# Patient Record
Sex: Male | Born: 1975 | Race: Black or African American | Hispanic: No | Marital: Single | Smoking: Former smoker
Health system: Southern US, Community
[De-identification: ages and names within clinical notes are randomized; demographics above are authoritative.]

## PROBLEM LIST (undated history)

## (undated) DIAGNOSIS — E785 Hyperlipidemia, unspecified: Secondary | ICD-10-CM

## (undated) DIAGNOSIS — A498 Other bacterial infections of unspecified site: Secondary | ICD-10-CM

## (undated) DIAGNOSIS — I10 Essential (primary) hypertension: Secondary | ICD-10-CM

## (undated) HISTORY — DX: Hyperlipidemia, unspecified: E78.5

## (undated) HISTORY — DX: Essential (primary) hypertension: I10

## (undated) HISTORY — PX: FECAL TRANSPLANT: SHX6383

---

## 1996-06-09 HISTORY — PX: SPINAL FUSION: SHX223

## 2015-11-26 DIAGNOSIS — R030 Elevated blood-pressure reading, without diagnosis of hypertension: Secondary | ICD-10-CM

## 2015-11-26 DIAGNOSIS — E7800 Pure hypercholesterolemia, unspecified: Secondary | ICD-10-CM | POA: Insufficient documentation

## 2015-11-26 DIAGNOSIS — IMO0001 Reserved for inherently not codable concepts without codable children: Secondary | ICD-10-CM | POA: Insufficient documentation

## 2015-12-04 ENCOUNTER — Ambulatory Visit (INDEPENDENT_AMBULATORY_CARE_PROVIDER_SITE_OTHER): Payer: BLUE CROSS/BLUE SHIELD | Admitting: Urology

## 2015-12-04 ENCOUNTER — Encounter: Payer: Self-pay | Admitting: Urology

## 2015-12-04 VITALS — BP 119/68 | HR 59 | Ht 70.0 in | Wt 185.8 lb

## 2015-12-04 DIAGNOSIS — Z309 Encounter for contraceptive management, unspecified: Secondary | ICD-10-CM

## 2015-12-04 DIAGNOSIS — Z3009 Encounter for other general counseling and advice on contraception: Secondary | ICD-10-CM

## 2015-12-04 MED ORDER — DIAZEPAM 10 MG PO TABS: ORAL_TABLET | ORAL | Status: AC

## 2015-12-04 NOTE — Progress Notes (Signed)
12/04/2015 8:58 AM   Mark Key 07/28/1975 [id number]  Referring provider: Dione HousekeeperMario Ernesto Olmedo, MD [address] [place], [place] [address]  Chief Complaint  Patient presents with  . VAS Consult    referred by Gwenlyn FoundErnesto Olmedo    HPI: Mr. Mark SantiagoWayne Coupe is a 40 year old African American male who is referred by his PCP, Dr. Zada Finderslmedo, for a vasectomy.  Patient has 2 children, sons, and wishes to end his family unit at this point.  Patient denies any history of chronic prostatitis, epididymitis, orchitis, or other genital pain.  Today, we discussed what the vas deferens is, where it is located, and its function. We reviewed the procedure for vasectomy, it's risks, benefits, alternatives, and likelihood of achieving his goals.   We discussed in detail the procedure, complications, and recovery as well as the need for clearance prior to unprotected intercourse. We discussed that vasectomy does not protect against sexually transmitted diseases. We discussed that this procedure does not result in immediate sterility and that they would need to use other forms of birth control until he has been cleared with a three month negative postvasectomy semen analyses.  I explained that the procedure is considered to be permanent and that attempts at reversal have varying degrees of success. These options include vasectomy reversal, sperm retrieval, and in vitro fertilization; these can be very expensive.   We discussed the chance of postvasectomy pain syndrome which occurs in less than 5% of patients. I explained to the patient that there is no treatment to resolve this chronic pain, and that if it developed I would not be able to help resolve the issue, but that surgery is generally not needed for correction.   I explained there have even been reports of systemic like illness associated with this chronic pain, and that there was no good cure. I explained that vasectomy it is not a 100% reliable form of  birth control, and the risk of pregnancy after vasectomy is approximately 1 in 2000 men who had a negative postvasectomy semen analysis or rare non-motile sperm.  I explained that repeat vasectomy was necessary in less than 1% of vasectomy procedures when employing the type of technique that is performed in the office. I explained that he should refrain from ejaculation for approximately one week following vasectomy. I explained that there are other options for birth control which are permanent and non-permanent; we discussed these.  I explained the rates of surgical complications, such as symptomatic hematoma or infection, are low (1-2%) and vary with the surgeon's experience and criteria used to diagnose the complication.   PMH: Past Medical History  Diagnosis Date  . HLD (hyperlipidemia)   . HTN (hypertension)     Surgical History: Past Surgical History  Procedure Laterality Date  . Spinal fusion  1998    Home Medications:    Medication List       This list is accurate as of: 12/04/15  8:58 AM.  Always use your most recent med list.               atorvastatin 40 MG tablet  Commonly known as:  LIPITOR  Take by mouth.     diazepam 10 MG tablet  Commonly known as:  VALIUM  Take 30 minutes prior to vasectomy     metoprolol tartrate 25 MG tablet  Commonly known as:  LOPRESSOR  Take by mouth.     naproxen 500 MG tablet  Commonly known as:  NAPROSYN  Take by mouth.        Allergies: No Known Allergies  Family History: Family History  Problem Relation Age of Onset  . Kidney disease Neg Hx   . Prostate cancer Neg Hx     Social History:  reports that he has quit smoking. He does not have any smokeless tobacco history on file. He reports that he drinks alcohol. He reports that he does not use illicit drugs.  ROS: UROLOGY Frequent Urination?: No Hard to postpone urination?: No Burning/pain with urination?: No Get up at night to urinate?: No Leakage of urine?:  No Urine stream starts and stops?: No Trouble starting stream?: No Do you have to strain to urinate?: No Blood in urine?: No Urinary tract infection?: No Sexually transmitted disease?: No Injury to kidneys or bladder?: No Painful intercourse?: No Weak stream?: No Erection problems?: No Penile pain?: No  Gastrointestinal Nausea?: No Vomiting?: No Indigestion/heartburn?: No Diarrhea?: No Constipation?: No  Constitutional Fever: No Night sweats?: No Weight loss?: No Fatigue?: No  Skin Skin rash/lesions?: No Itching?: No  Eyes Blurred vision?: No Double vision?: No  Ears/Nose/Throat Sore throat?: No Sinus problems?: No  Hematologic/Lymphatic Swollen glands?: No Easy bruising?: No  Cardiovascular Leg swelling?: No Chest pain?: No  Respiratory Cough?: No Shortness of breath?: No  Endocrine Excessive thirst?: No  Musculoskeletal Back pain?: No Joint pain?: No  Neurological Headaches?: No Dizziness?: No  Psychologic Depression?: No Anxiety?: No  Physical Exam: BP 119/68 mmHg  Pulse 59  Ht  (1.778 m)  Wt 185 lb 12.8 oz (84.278 kg)  BMI 26.66 kg/m2  Constitutional: Well nourished. Alert and oriented, No acute distress. HEENT: [place] AT, moist mucus membranes. Trachea midline, no masses. Cardiovascular: No clubbing, cyanosis, or edema. Respiratory: Normal respiratory effort, no increased work of breathing. GI: Abdomen is soft, non tender, non distended, no abdominal masses. Liver and spleen not palpable.  No hernias appreciated.  Stool sample for occult testing is not indicated.   GU: No CVA tenderness.  No bladder fullness or masses.  Patient with circumcised phallus.   Urethral meatus is patent.  No penile discharge. No penile lesions or rashes. Scrotum without lesions, cysts, rashes and/or edema.  Testicles are located scrotally bilaterally. No masses are appreciated in the testicles. Left and right epididymis are normal. Rectal: Deferred.     Skin: No rashes, bruises or suspicious lesions. Lymph: No cervical or inguinal adenopathy. Neurologic: Grossly intact, no focal deficits, moving all 4 extremities. Psychiatric: Normal mood and affect.   Assessment & Plan:    1. Vasectomy consult:  Patient has read and signed the consent.  He is given the pre-op vasectomy instruction sheet.  He is prescribed Valium 10 mg and instructed to take it 30 minutes prior to his vasectomy appointment.  He is to have a driver.  I reemphasized to the patient that this is to be considered a permanent form of birth control, that he is to use an alternative form of birth control until we receive the 3 months specimen and it is cleared of sperm and that this will not prevent STI's.  His questions are answered to his satisfaction and he understands the risks and is willing to proceed with the vasectomy.  He will schedule his vasectomy.    I spent 30 minutes in a face-to-face conversation concerning the vasectomy procedure and pre-and post op expectations.  Greater than 50% was spent in counseling & coordination of care with the patient.   Return for schedule  vasectomy.  These notes generated with voice recognition software. I apologize for typographical errors.  Michiel CowboySHANNON Beyza Bellino, PA-C  [facility] [address], [address] [place], [place] [address] [phone]

## 2015-12-08 ENCOUNTER — Encounter: Payer: Self-pay | Admitting: Urology

## 2015-12-08 ENCOUNTER — Telehealth: Payer: Self-pay | Admitting: Urology

## 2015-12-08 NOTE — Telephone Encounter (Signed)
Would you please send a copy of this office visit to the patient's referring provider, Dr. Olmedo?     

## 2015-12-18 ENCOUNTER — Telehealth: Payer: Self-pay | Admitting: Urology

## 2015-12-18 NOTE — Telephone Encounter (Signed)
pcp said that they received your fax   MasthopeMichelle

## 2016-01-04 ENCOUNTER — Encounter: Payer: Self-pay | Admitting: Urology

## 2016-01-04 ENCOUNTER — Ambulatory Visit (INDEPENDENT_AMBULATORY_CARE_PROVIDER_SITE_OTHER): Payer: BLUE CROSS/BLUE SHIELD | Admitting: Urology

## 2016-01-04 VITALS — BP 119/81 | HR 71 | Ht 69.0 in | Wt 190.0 lb

## 2016-01-04 DIAGNOSIS — Z3009 Encounter for other general counseling and advice on contraception: Secondary | ICD-10-CM

## 2016-01-04 DIAGNOSIS — Z8739 Personal history of other diseases of the musculoskeletal system and connective tissue: Secondary | ICD-10-CM | POA: Insufficient documentation

## 2016-01-04 MED ORDER — OXYCODONE-ACETAMINOPHEN 5-325 MG PO TABS: 1.0000 | ORAL_TABLET | ORAL | 0 refills | Status: AC | PRN

## 2016-01-04 NOTE — Progress Notes (Signed)
Bilateral Vasectomy Procedure  Pre-Procedure: - Patient's scrotum was prepped and draped for vasectomy. - The vas was palpated through the scrotal skin on the left. - 1% Xylocaine was injected into the skin and surrounding tissue for placement  - In a similar manner, the vas on the right was identified, anesthetized, and stabilized.  Procedure: - A scalpel was used to make a 1 cm incision in his left hemiscrotum - The left vas was isolated and brought up through the incision exposing that structure. - Bleeding points were cauterized as they occurred. - The vas was free from the surrounding structures and brought into view. - A segment was positioned for placement with a hemostat. - A second hemostat was placed and a small segment between the two hemostats and was removed for inspection. - Each end of the transected vas lumen was fulgurated/obliterated using needlepoint electrocautery. It was then tied off with a #2-0 silk suture -The same procedure was performed on the right. - A suture of #3-0 chromic catgut was used to close each lateral scrotal skin incision  Post-Procedure: - Patient was instructed in care of the operative area - A specimen is to be delivered in 12 and 16 weeks   -Another form of contraception is to be used until he is cleared by the office.   

## 2016-01-07 ENCOUNTER — Ambulatory Visit (INDEPENDENT_AMBULATORY_CARE_PROVIDER_SITE_OTHER): Payer: BLUE CROSS/BLUE SHIELD | Admitting: Urology

## 2016-01-07 ENCOUNTER — Encounter: Payer: Self-pay | Admitting: Urology

## 2016-01-07 VITALS — BP 131/79 | HR 74 | Ht 69.0 in | Wt 189.6 lb

## 2016-01-07 DIAGNOSIS — N5089 Other specified disorders of the male genital organs: Secondary | ICD-10-CM

## 2016-01-07 DIAGNOSIS — Z9852 Vasectomy status: Secondary | ICD-10-CM

## 2016-01-07 NOTE — Progress Notes (Signed)
01/07/2016 9:38 AM   Mark Key [birth year] [id number]  Referring provider: Dione Housekeeper, MD [address] [place], [place] [address]  Chief Complaint  Patient presents with  . Post-op Problem    Scrotal swelling after vasectomy    HPI: Patient is a 40 year old African American male who underwent vasectomy on 01/04/2016 who presents today stating that his scrotum is the size of a baseball.  Background history Mr. Mark Key is a 40 year old African American male who was referred by his PCP, Dr. Zada Finders, for a vasectomy.  Patient has 2 children, sons, and wishes to end his family unit at this point.  Patient denies any history of chronic prostatitis, epididymitis, orchitis, or other genital pain.    Post-procedure, the patient states he had no complications the night of the vasectomy.  The right scrotal area started to swell the next day in the evening.  It was larger yesterday.    Today, he states the swelling has decreased.  He has an uncomfortableness in the scrotal area.  He has not had drainage from the scrotum, fevers or erythema.  He has not had difficulty urinating, chills, nausea or vomiting.     PMH: Past Medical History:  Diagnosis Date  . HLD (hyperlipidemia)   . HTN (hypertension)     Surgical History: Past Surgical History:  Procedure Laterality Date  . SPINAL FUSION  1998    Home Medications:    Medication List       Accurate as of 01/07/16  9:38 AM. Always use your most recent med list.          atorvastatin 40 MG tablet Commonly known as:  LIPITOR Take by mouth.   diazepam 10 MG tablet Commonly known as:  VALIUM Take 30 minutes prior to vasectomy   metoprolol tartrate 25 MG tablet Commonly known as:  LOPRESSOR Take by mouth.   oxyCODONE-acetaminophen 5-325 MG tablet Commonly known as:  ROXICET Take 1-2 tablets by mouth every 4 (four) hours as needed for severe pain.       Allergies: No Known Allergies  Family  History: Family History  Problem Relation Age of Onset  . Kidney disease Neg Hx   . Prostate cancer Neg Hx     Social History:  reports that he has quit smoking. He does not have any smokeless tobacco history on file. He reports that he drinks alcohol. He reports that he does not use drugs.  ROS: UROLOGY Frequent Urination?: No Hard to postpone urination?: No Burning/pain with urination?: No Get up at night to urinate?: No Leakage of urine?: No Urine stream starts and stops?: No Trouble starting stream?: No Do you have to strain to urinate?: No Blood in urine?: No Urinary tract infection?: No Sexually transmitted disease?: No Injury to kidneys or bladder?: No Painful intercourse?: No Weak stream?: No Erection problems?: No Penile pain?: No  Gastrointestinal Nausea?: No Vomiting?: No Indigestion/heartburn?: No Diarrhea?: No Constipation?: No  Constitutional Fever: No Night sweats?: No Weight loss?: No Fatigue?: No  Skin Skin rash/lesions?: No Itching?: No  Eyes Blurred vision?: No Double vision?: No  Ears/Nose/Throat Sore throat?: No Sinus problems?: No  Hematologic/Lymphatic Swollen glands?: No Easy bruising?: No  Cardiovascular Leg swelling?: No Chest pain?: No  Respiratory Cough?: No Shortness of breath?: No  Endocrine Excessive thirst?: No  Musculoskeletal Back pain?: No Joint pain?: No  Neurological Headaches?: No Dizziness?: No  Psychologic Depression?: No Anxiety?: No  Physical Exam: BP 131/79  Pulse 74   Ht  (1.753 m)   Wt 189 lb 9.6 oz (86 kg)   BMI 28.00 kg/m   Constitutional: Well nourished. Alert and oriented, No acute distress. HEENT: [place] AT, moist mucus membranes. Trachea midline, no masses. Cardiovascular: No clubbing, cyanosis, or edema. Respiratory: Normal respiratory effort, no increased work of breathing. GI: Abdomen is soft, non tender, non distended, no abdominal masses. Liver and spleen not  palpable.  No hernias appreciated.  Stool sample for occult testing is not indicated.   GU: No CVA tenderness.  No bladder fullness or masses.  Patient with circumcised phallus.   Urethral meatus is patent.  No penile discharge. No penile lesions or rashes. Scrotum without lesions, cysts, rashes and/or edema.  Testicles are located scrotally bilaterally.  Right testicle and cord are enlarged, not exquisite painful to palpation.  No masses are appreciated in the testicles. Left and right epididymis are normal. Rectal: Deferred.   Skin: No rashes, bruises or suspicious lesions. Lymph: No cervical or inguinal adenopathy. Neurologic: Grossly intact, no focal deficits, moving all 4 extremities. Psychiatric: Normal mood and affect.   Assessment & Plan:    1. S/p vasectomy:   Reminded about using alternative birthcontrol until 3 month specimen is received.    2. Right testicular swelling:   Reactive testicular swelling.  Patient reassured that this is normal after a vasectomy.    Return if symptoms worsen or fail to improve.  These notes generated with voice recognition software. I apologize for typographical errors.  Michiel Cowboy, PA-C  [facility] [address], [address] [place], [place] [address] [phone]

## 2016-01-08 ENCOUNTER — Telehealth: Payer: Self-pay | Admitting: Urology

## 2016-01-08 ENCOUNTER — Other Ambulatory Visit: Payer: Self-pay | Admitting: Urology

## 2016-01-08 MED ORDER — KETOROLAC TROMETHAMINE 10 MG PO TABS: 10.0000 mg | ORAL_TABLET | Freq: Four times a day (QID) | ORAL | 0 refills | Status: AC | PRN

## 2016-01-08 NOTE — Telephone Encounter (Signed)
He may have Toradol.  I have sent it to the pharmacy.

## 2016-01-14 ENCOUNTER — Ambulatory Visit: Payer: BLUE CROSS/BLUE SHIELD | Admitting: Urology

## 2018-01-27 ENCOUNTER — Encounter: Payer: Self-pay | Admitting: Emergency Medicine

## 2018-01-27 ENCOUNTER — Emergency Department
Admission: EM | Admit: 2018-01-27 | Discharge: 2018-01-27 | Disposition: A | Discharge status: Home / Self Care | Payer: BLUE CROSS/BLUE SHIELD | Attending: Emergency Medicine | Admitting: Emergency Medicine

## 2018-01-27 ENCOUNTER — Emergency Department: Payer: BLUE CROSS/BLUE SHIELD

## 2018-01-27 DIAGNOSIS — Z87891 Personal history of nicotine dependence: Secondary | ICD-10-CM | POA: Diagnosis not present

## 2018-01-27 DIAGNOSIS — Z79899 Other long term (current) drug therapy: Secondary | ICD-10-CM | POA: Insufficient documentation

## 2018-01-27 DIAGNOSIS — K047 Periapical abscess without sinus: Secondary | ICD-10-CM | POA: Insufficient documentation

## 2018-01-27 DIAGNOSIS — K0889 Other specified disorders of teeth and supporting structures: Secondary | ICD-10-CM | POA: Diagnosis present

## 2018-01-27 DIAGNOSIS — I10 Essential (primary) hypertension: Secondary | ICD-10-CM | POA: Insufficient documentation

## 2018-01-27 DIAGNOSIS — E785 Hyperlipidemia, unspecified: Secondary | ICD-10-CM | POA: Diagnosis not present

## 2018-01-27 LAB — COMPREHENSIVE METABOLIC PANEL
ALT: 32 U/L (ref 0–44)
ANION GAP: 9 (ref 5–15)
AST: 29 U/L (ref 15–41)
Albumin: 4.4 g/dL (ref 3.5–5.0)
Alkaline Phosphatase: 29 U/L — ABNORMAL LOW (ref 38–126)
BUN: 11 mg/dL (ref 6–20)
CHLORIDE: 102 mmol/L (ref 98–111)
CO2: 26 mmol/L (ref 22–32)
CREATININE: 1.11 mg/dL (ref 0.61–1.24)
Calcium: 8.9 mg/dL (ref 8.9–10.3)
Glucose, Bld: 125 mg/dL — ABNORMAL HIGH (ref 70–99)
Potassium: 3.7 mmol/L (ref 3.5–5.1)
SODIUM: 137 mmol/L (ref 135–145)
Total Bilirubin: 0.9 mg/dL (ref 0.3–1.2)
Total Protein: 7.7 g/dL (ref 6.5–8.1)

## 2018-01-27 LAB — CBC WITH DIFFERENTIAL/PLATELET
Basophils Absolute: 0 10*3/uL (ref 0–0.1)
Basophils Relative: 0 %
EOS ABS: 0 10*3/uL (ref 0–0.7)
EOS PCT: 1 %
HCT: 47.1 % (ref 40.0–52.0)
Hemoglobin: 16.4 g/dL (ref 13.0–18.0)
LYMPHS ABS: 0.9 10*3/uL — AB (ref 1.0–3.6)
LYMPHS PCT: 17 %
MCH: 30.6 pg (ref 26.0–34.0)
MCHC: 34.9 g/dL (ref 32.0–36.0)
MCV: 87.7 fL (ref 80.0–100.0)
MONO ABS: 0.6 10*3/uL (ref 0.2–1.0)
MONOS PCT: 12 %
Neutro Abs: 3.6 10*3/uL (ref 1.4–6.5)
Neutrophils Relative %: 70 %
PLATELETS: 158 10*3/uL (ref 150–440)
RBC: 5.37 MIL/uL (ref 4.40–5.90)
RDW: 13.7 % (ref 11.5–14.5)
WBC: 5.2 10*3/uL (ref 3.8–10.6)

## 2018-01-27 MED ORDER — CLINDAMYCIN HCL 150 MG PO CAPS: ORAL_CAPSULE | ORAL | 0 refills | Status: AC

## 2018-01-27 MED ORDER — IOPAMIDOL (ISOVUE-300) INJECTION 61%
75.0000 mL | Freq: Once | INTRAVENOUS | Status: AC | PRN
  Administered 2018-01-27: 75 mL via INTRAVENOUS
  Filled 2018-01-27: qty 75

## 2018-01-27 MED ORDER — CLINDAMYCIN PHOSPHATE 600 MG/50ML IV SOLN
600.0000 mg | Freq: Once | INTRAVENOUS | Status: AC
  Administered 2018-01-27: 600 mg via INTRAVENOUS
  Filled 2018-01-27: qty 50

## 2018-01-27 MED ORDER — TRAMADOL HCL 50 MG PO TABS: 50.0000 mg | ORAL_TABLET | Freq: Four times a day (QID) | ORAL | 0 refills | Status: AC | PRN

## 2018-01-27 NOTE — ED Triage Notes (Signed)
Pt reports toothache to left lower jaw since Tuesday.

## 2018-01-27 NOTE — Discharge Instructions (Addendum)
Follow-up with your primary care provider or dentist for continued care of your dental abscess.  Continue taking the Augmentin as prescribed by your doctor.  Also begin taking clindamycin 150 mg every 6 hours for the next 7 days.  Also if you have not already done so to make an appointment with a dentist.

## 2018-01-27 NOTE — ED Notes (Addendum)
First Nurse Note: Patient states he was seen in Urgent Care on Monday and placed on antibiotics for a possible abscessed tooth.  Here this AM complaining of "golf ball" sized lump "under my tongue".  Speech clear.  NAD.

## 2018-01-27 NOTE — ED Triage Notes (Signed)
Pt BP elevated. Pt states has a hx of HTN but has not been taking his meds. Encouraged pt to take keep check on BP, f/u with MD and take meds as prescribed.

## 2018-01-27 NOTE — ED Notes (Signed)
See triage note  Presents with possible dental abscess  Left lower gumline swollen and tender  Was seen on Friday and placed on augmentin and indocin  His dentist was not available on monday

## 2018-01-27 NOTE — ED Provider Notes (Signed)
[facility] Emergency Department Provider Note   ____________________________________________   First MD Initiated Contact with Patient 01/27/18 [id number]     (approximate)  I have reviewed the triage vital signs and the nursing notes.   HISTORY  Chief Complaint Dental Pain    HPI Mark SantiagoWayne Key is a 42 y.o. male presents to the emergency department with complaint of questionable dental abscess.  Patient states that he was seen and placed on Augmentin and indomethacin.  He states that he feels that the area is getting worse instead of better.  He denies any fever or chills.  There is been no nausea or vomiting.  Patient has continued to eat without any difficulty and has no problems swallowing.  She states he also has history of hypertension but is currently not taking his blood pressure medication.  He denies any chest pain, shortness of breath or headache.  There is been no visual changes.  He does have a PCP and sees him for follow-ups.  He rates his pain as an 8 out of 10.   Past Medical History:  Diagnosis Date  . HLD (hyperlipidemia)   . HTN (hypertension)     Patient Active Problem List   Diagnosis Date Noted  . Personal history of other diseases of the musculoskeletal system and connective tissue 01/04/2016  . Blood pressure elevated 11/26/2015  . Elevated low-density lipoprotein level 11/26/2015    Past Surgical History:  Procedure Laterality Date  . SPINAL FUSION  1998    Prior to Admission medications   Medication Sig Start Date End Date Taking? Authorizing Provider  amoxicillin-clavulanate (AUGMENTIN) 875-125 MG tablet Take 1 tablet by mouth 2 (two) times daily.   Yes [provider]  indomethacin (INDOCIN) 50 MG capsule Take 50 mg by mouth 3 (three) times daily with meals.   Yes [provider]  atorvastatin (LIPITOR) 40 MG tablet Take by mouth. 11/14/15 11/13/16  [provider]  clindamycin (CLEOCIN) 150 MG capsule  1 q 6 hours until finished 01/27/18   Bridget HartshornSummers, Kadi Hession L, PA-C  diazepam (VALIUM) 10 MG tablet Take 30 minutes prior to vasectomy Patient not taking: Reported on 01/07/2016 12/04/15   Michiel CowboyMcGowan, Shannon A, PA-C  ketorolac (TORADOL) 10 MG tablet Take 1 tablet (10 mg total) by mouth every 6 (six) hours as needed. 01/08/16   Michiel CowboyMcGowan, Shannon A, PA-C  metoprolol tartrate (LOPRESSOR) 25 MG tablet Take by mouth. 11/26/15 11/25/16  [provider]  oxyCODONE-acetaminophen (ROXICET) 5-325 MG tablet Take 1-2 tablets by mouth every 4 (four) hours as needed for severe pain. 01/04/16   Hildred LaserBudzyn, Brian James, MD  traMADol (ULTRAM) 50 MG tablet Take 1 tablet (50 mg total) by mouth every 6 (six) hours as needed. 01/27/18   Tommi RumpsSummers, Sham Alviar L, PA-C    Allergies Patient has no known allergies.  Family History  Problem Relation Age of Onset  . Kidney disease Neg Hx   . Prostate cancer Neg Hx     Social History Social History   Tobacco Use  . Smoking status: Former Games developermoker  . Tobacco comment: quit 6 months  Substance Use Topics  . Alcohol use: Yes    Alcohol/week: 0.0 standard drinks  . Drug use: No    Review of Systems Constitutional: No fever/chills Eyes: No visual changes. ENT: Positive dental pain and mandibular swelling. Cardiovascular: Denies chest pain. Respiratory: Denies shortness of breath. Gastrointestinal:   No nausea, no vomiting.   Musculoskeletal: Negative for back pain. Skin: Negative for  rash. Neurological: Negative for headaches, focal weakness or numbness. ____________________________________________   PHYSICAL EXAM:  VITAL SIGNS: ED Triage Vitals  Enc Vitals Group     BP 01/27/18 0853 138/82     Pulse Rate 01/27/18 0853 78     Resp 01/27/18 0853 20     Temp 01/27/18 0853 98.6 F (37 C)     Temp Source 01/27/18 0853 Oral     SpO2 01/27/18 0853 98 %     Weight 01/27/18 0841 185 lb (83.9 kg)     Height 01/27/18 0841 5\' 10"  (1.778 m)     Head Circumference --      Peak  Flow --      Pain Score 01/27/18 0841 8     Pain Loc --      Pain Edu? --      Excl. in GC? --    Constitutional: Alert and oriented. Well appearing and in no acute distress. Eyes: Conjunctivae are normal. PERRL. EOMI. Head: Atraumatic. Nose: No congestion/rhinnorhea. Mouth/Throat: Mucous membranes are moist.  Oropharynx non-erythematous.  Left lower premolar with carry present.  Gum is edematous and tender.  Soft tissue sublingual area is slightly edematous but no erythema or drainage noted.  There is no appreciated fullness anteriorly however there is tenderness and soft tissue edema present left lateral mandible. Neck: No stridor.   Hematological/Lymphatic/Immunilogical: No cervical lymphadenopathy. Cardiovascular: Normal rate, regular rhythm. Grossly normal heart sounds.  Good peripheral circulation. Respiratory: Normal respiratory effort.  No retractions. Lungs CTAB. Musculoskeletal: Moves upper and lower extremities without any difficulty and normal gait was noted. Neurologic:  Normal speech and language. No gross focal neurologic deficits are appreciated. No gait instability. Skin:  Skin is warm, dry and intact. No rash noted. Psychiatric: Mood and affect are normal. Speech and behavior are normal.  ____________________________________________   LABS (all labs ordered are listed, but only abnormal results are displayed)  Labs Reviewed  CBC WITH DIFFERENTIAL/PLATELET - Abnormal; Notable for the following components:      Result Value   Lymphs Abs 0.9 (*)    All other components within normal limits  COMPREHENSIVE METABOLIC PANEL - Abnormal; Notable for the following components:   Glucose, Bld 125 (*)    Alkaline Phosphatase 29 (*)    All other components within normal limits    RADIOLOGY   Official radiology report(s): Ct Soft Tissue Neck W Contrast  Result Date: 01/27/2018 CLINICAL DATA:  Pulsatile neck mass.  Rule out Ludwig's angina. EXAM: CT NECK WITH CONTRAST  TECHNIQUE: Multidetector CT imaging of the neck was performed using the standard protocol following the bolus administration of intravenous contrast. CONTRAST:  75mL ISOVUE-300 IOPAMIDOL (ISOVUE-300) INJECTION 61% COMPARISON:  None. FINDINGS: Pharynx and larynx: Soft tissue swelling in the floor of the mouth on the left. There is edema between the mylohyoid muscle and the mandible without well-defined fluid collection. This presumably related to dental infection with periapical lucency around left lower pre molar without definite cortical erosion. Normal pharynx.  Epiglottis and larynx normal. Salivary glands: No inflammation, mass, or stone. Thyroid: Negative Lymph nodes: No pathologic lymph nodes. Small subcentimeter level 2 and submandibular nodes on the left likely reactive due to infection Vascular: Carotid artery and jugular vein patent bilaterally. Limited intracranial: Negative Visualized orbits: Negative Mastoids and visualized paranasal sinuses: Mucosal edema and contraction of the left maxillary sinus appears chronic. Remaining sinuses clear Skeleton: Mild cervical spine degenerative change in spurring Upper chest: Negative Other: Mild soft tissue swelling and edema  in the subcutaneous fat lateral and inferior to the mandible on the left. Thickening of the left latissimus muscle. IMPRESSION: Soft tissue swelling in the floor of the mouth on the left. There is edema between the mylohyoid muscle and mandible presumably related to dental infection. No well-defined abscess. Periapical lucency around left lower pre molar presumably the cause of infection. Mild swelling in the soft tissues and platysmas on the left below the mandible. Electronically Signed   By: Marlan Palau M.D.   On: 01/27/2018 10:38    ____________________________________________   PROCEDURES  Procedure(s) performed: None  Procedures  Critical Care performed: No  ____________________________________________   INITIAL  IMPRESSION / ASSESSMENT AND PLAN / ED COURSE  As part of my medical decision making, I reviewed the following data within the electronic MEDICAL RECORD NUMBER Notes from prior ED visits and  Controlled Substance Database  Patient presents with complaint of dental pain and swelling along with the sensation that he has swelling under his tongue and to the anterior portion of his mouth.  Patient is maintaining secretions and is able to talk without any difficulties.  Lab work was reassuring and also CT to rule out Ludwig's angina.  Patient was given clindamycin IV while in the department along with continued medication of clindamycin after discharge.  Patient was given prescription for tramadol 50 mg every 6 hours as needed for pain.  He is to follow-up with 1 of the dental clinics listed on his discharge papers or dentist of his choice.  ____________________________________________   FINAL CLINICAL IMPRESSION(S) / ED DIAGNOSES  Final diagnoses:  Dental abscess     ED Discharge Orders         Ordered    clindamycin (CLEOCIN) 150 MG capsule     01/27/18 1131    traMADol (ULTRAM) 50 MG tablet  Every 6 hours PRN     01/27/18 1142           Note:  This document was prepared using Dragon voice recognition software and may include unintentional dictation errors.    Tommi Rumps, PA-C 01/27/18 1531    Emily Filbert, MD 01/28/18 [phone]

## 2018-07-10 ENCOUNTER — Emergency Department
Admission: EM | Admit: 2018-07-10 | Discharge: 2018-07-11 | Disposition: A | Discharge status: Home / Self Care | Payer: BLUE CROSS/BLUE SHIELD | Attending: Emergency Medicine | Admitting: Emergency Medicine

## 2018-07-10 DIAGNOSIS — K6289 Other specified diseases of anus and rectum: Secondary | ICD-10-CM | POA: Insufficient documentation

## 2018-07-10 DIAGNOSIS — Z79899 Other long term (current) drug therapy: Secondary | ICD-10-CM | POA: Insufficient documentation

## 2018-07-10 DIAGNOSIS — Z87891 Personal history of nicotine dependence: Secondary | ICD-10-CM | POA: Insufficient documentation

## 2018-07-10 DIAGNOSIS — I10 Essential (primary) hypertension: Secondary | ICD-10-CM | POA: Insufficient documentation

## 2018-07-10 HISTORY — DX: Other bacterial infections of unspecified site: A49.8

## 2018-07-10 LAB — CBC
HCT: 48.8 % (ref 39.0–52.0)
HEMOGLOBIN: 16.7 g/dL (ref 13.0–17.0)
MCH: 29.5 pg (ref 26.0–34.0)
MCHC: 34.2 g/dL (ref 30.0–36.0)
MCV: 86.1 fL (ref 80.0–100.0)
Platelets: 253 10*3/uL (ref 150–400)
RBC: 5.67 MIL/uL (ref 4.22–5.81)
RDW: 12.3 % (ref 11.5–15.5)
WBC: 7.5 10*3/uL (ref 4.0–10.5)
nRBC: 0 % (ref 0.0–0.2)

## 2018-07-10 LAB — COMPREHENSIVE METABOLIC PANEL
ALK PHOS: 33 U/L — AB (ref 38–126)
ALT: 37 U/L (ref 0–44)
AST: 26 U/L (ref 15–41)
Albumin: 4.6 g/dL (ref 3.5–5.0)
Anion gap: 7 (ref 5–15)
BUN: 14 mg/dL (ref 6–20)
CHLORIDE: 102 mmol/L (ref 98–111)
CO2: 25 mmol/L (ref 22–32)
CREATININE: 1.19 mg/dL (ref 0.61–1.24)
Calcium: 9.2 mg/dL (ref 8.9–10.3)
GFR calc Af Amer: >60 mL/min (ref >60)
Glucose, Bld: 105 mg/dL — ABNORMAL HIGH (ref 70–99)
Potassium: 3.6 mmol/L (ref 3.5–5.1)
Sodium: 134 mmol/L — ABNORMAL LOW (ref 135–145)
TOTAL PROTEIN: 7.8 g/dL (ref 6.5–8.1)
Total Bilirubin: 1 mg/dL (ref 0.3–1.2)

## 2018-07-10 NOTE — ED Triage Notes (Signed)
Patient c/o abscess to rectal area that arose the day after colonoscopy.  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

## 2018-07-11 ENCOUNTER — Encounter: Payer: Self-pay | Admitting: Radiology

## 2018-07-11 ENCOUNTER — Emergency Department: Payer: BLUE CROSS/BLUE SHIELD

## 2018-07-11 MED ORDER — HYDROCORTISONE ACETATE 25 MG RE SUPP: 25.0000 mg | Freq: Every day | RECTAL | 0 refills | Status: AC

## 2018-07-11 MED ORDER — IOPAMIDOL (ISOVUE-300) INJECTION 61%
100.0000 mL | Freq: Once | INTRAVENOUS | Status: AC | PRN
  Administered 2018-07-11: 100 mL via INTRAVENOUS

## 2018-07-11 MED ORDER — HYDROCODONE-ACETAMINOPHEN 5-325 MG PO TABS
1.0000 | ORAL_TABLET | Freq: Once | ORAL | Status: AC
  Administered 2018-07-11: 1 via ORAL
  Filled 2018-07-11: qty 1

## 2018-07-11 MED ORDER — IOPAMIDOL (ISOVUE-300) INJECTION 61%
30.0000 mL | Freq: Once | INTRAVENOUS | Status: AC | PRN
  Administered 2018-07-11: 30 mL via INTRAVENOUS

## 2018-07-11 MED ORDER — HYDROCODONE-ACETAMINOPHEN 5-325 MG PO TABS: 1.0000 | ORAL_TABLET | Freq: Four times a day (QID) | ORAL | 0 refills | Status: DC | PRN

## 2018-07-11 MED ORDER — HYDROCODONE-ACETAMINOPHEN 5-325 MG PO TABS: 1.0000 | ORAL_TABLET | Freq: Four times a day (QID) | ORAL | 0 refills | Status: AC | PRN

## 2018-07-11 NOTE — Discharge Instructions (Addendum)
Follow-up closely with [facility], suggest with them in clinic within a couple of days.   Please return to the emergency room right away if you are to develop a fever, severe nausea, your pain becomes severe or worsens, you are unable to keep food down, begin vomiting any dark or bloody fluid, you develop any dark or bloody stools, feel dehydrated, or other new concerns or symptoms arise.

## 2018-07-11 NOTE — ED Notes (Signed)
Reviewed discharge instructions, follow-up care, and prescriptions with patient. Patient verbalized understanding of all information reviewed. Patient stable, with no distress noted at this time.    

## 2018-07-11 NOTE — ED Notes (Signed)
Patient transported to CT 

## 2018-07-11 NOTE — ED Provider Notes (Signed)
[facility] Emergency Department Provider Note   ____________________________________________   First MD Initiated Contact with Patient 07/10/18 2314     (approximate)  I have reviewed the triage vital signs and the nursing notes.   HISTORY  Chief Complaint Abscess and Post-op Problem    HPI Mark Key is a 43 y.o. male here for evaluation of pain around his rectum  Patient reports he had a fecal transplant recently for C. difficile, he has not had any ongoing diarrhea, vomiting fevers or symptoms of C. difficile.  He has been improving but had a colonoscopy about a week ago for which she has not yet had his biopsy results returned at [facility].  He reports that almost immediately after the colonoscopy and experiencing pain and is been having ongoing pain in the rectal area.  He wants to make sure he has not developed something like a pus pocket or an abscess in his rectum after the colonoscopy given the amount of pain.  He did contact [facility] and was given a prescription for suppository, but could not have it filled because a prescription pharmacy did not have it.  [facility] also can get him into clinic on Friday.  He went to [facility] day but left because of the long wait time  Currently reports a moderate pain just in the rectal area.  He is not seen any swelling on the outside, reports pain internal to the rectum.  Vomiting.  No fevers or chills.   Past Medical History:  Diagnosis Date  . Clostridium difficile infection   . HLD (hyperlipidemia)   . HTN (hypertension)     Patient Active Problem List   Diagnosis Date Noted  . Personal history of other diseases of the musculoskeletal system and connective tissue 01/04/2016  . Blood pressure elevated 11/26/2015  . Elevated low-density lipoprotein level 11/26/2015    Past Surgical History:  Procedure Laterality Date  . FECAL TRANSPLANT    . SPINAL FUSION  1998    Prior to Admission  medications   Medication Sig Start Date End Date Taking? Authorizing Provider  amoxicillin-clavulanate (AUGMENTIN) 875-125 MG tablet Take 1 tablet by mouth 2 (two) times daily.    [provider]  atorvastatin (LIPITOR) 40 MG tablet Take by mouth. 11/14/15 11/13/16  [provider]  clindamycin (CLEOCIN) 150 MG capsule 1 q 6 hours until finished 01/27/18   Bridget Hartshorn L, PA-C  diazepam (VALIUM) 10 MG tablet Take 30 minutes prior to vasectomy Patient not taking: Reported on 01/07/2016 12/04/15   Michiel Cowboy A, PA-C  HYDROcodone-acetaminophen (NORCO/VICODIN) 5-325 MG tablet Take 1 tablet by mouth every 6 (six) hours as needed for moderate pain. 07/11/18   Sharyn Creamer, MD  hydrocortisone (ANUSOL-HC) 25 MG suppository Place 1 suppository (25 mg total) rectally at bedtime for 5 days. 07/11/18 07/16/18  Sharyn Creamer, MD  indomethacin (INDOCIN) 50 MG capsule Take 50 mg by mouth 3 (three) times daily with meals.    [provider]  ketorolac (TORADOL) 10 MG tablet Take 1 tablet (10 mg total) by mouth every 6 (six) hours as needed. 01/08/16   Michiel Cowboy A, PA-C  metoprolol tartrate (LOPRESSOR) 25 MG tablet Take by mouth. 11/26/15 11/25/16  [provider]  oxyCODONE-acetaminophen (ROXICET) 5-325 MG tablet Take 1-2 tablets by mouth every 4 (four) hours as needed for severe pain. 01/04/16   Hildred Laser, MD  traMADol (ULTRAM) 50 MG tablet Take 1 tablet (50 mg total) by mouth  every 6 (six) hours as needed. 01/27/18   Tommi Rumps, PA-C    Allergies Patient has no known allergies.  Family History  Problem Relation Age of Onset  . Kidney disease Neg Hx   . Prostate cancer Neg Hx     Social History Social History   Tobacco Use  . Smoking status: Former Games developer  . Smokeless tobacco: Never Used  . Tobacco comment: quit 6 months  Substance Use Topics  . Alcohol use: Yes    Alcohol/week: 0.0 standard drinks  . Drug use: No    Review of  Systems Constitutional: No fever/chills Eyes: No visual changes. ENT: No sore throat. Cardiovascular: Denies chest pain. Respiratory: Denies shortness of breath. Gastrointestinal: No abdominal pain.  See HPI regarding rectal pain Genitourinary: Negative for dysuria. Musculoskeletal: Negative for back pain. Skin: Negative for rash. Neurological: Negative for headaches, areas of focal weakness or numbness.    ____________________________________________   PHYSICAL EXAM:  VITAL SIGNS: ED Triage Vitals  Enc Vitals Group     BP 07/10/18 1921 (!) 147/87     Pulse Rate 07/10/18 1921 71     Resp 07/10/18 1921 18     Temp 07/10/18 1921 97.7 F (36.5 C)     Temp Source 07/10/18 1921 Oral     SpO2 07/10/18 1921 98 %     Weight 07/10/18 1922 185 lb (83.9 kg)     Height 07/10/18 1922 5\' 10"  (1.778 m)     Head Circumference --      Peak Flow --      Pain Score 07/10/18 1922 10     Pain Loc --      Pain Edu? --      Excl. in GC? --     Constitutional: Alert and oriented. Well appearing and in no acute distress. Eyes: Conjunctivae are normal. Head: Atraumatic. Nose: No congestion/rhinnorhea. Mouth/Throat: Mucous membranes are moist. Neck: No stridor.  Cardiovascular: Normal rate, regular rhythm. Grossly normal heart sounds.  Good peripheral circulation. Respiratory: Normal respiratory effort.  No retractions. Lungs CTAB. Gastrointestinal: Soft and nontender. No distention. Rectal: No external abscess noted on inspection or palpation.  Perineum is normal.  There is a small slightly tender external hemorrhoid that does not appear to account for his pain or discomfort.  He does however have some tenesmus, pain to discomfort to palpation and digital examination.  Stool was brown and formed and heme-negative. Musculoskeletal: No lower extremity tenderness nor edema. Neurologic:  Normal speech and language. No gross focal neurologic deficits are appreciated.  Skin:  Skin is warm, dry and  intact. No rash noted. Psychiatric: Mood and affect are normal. Speech and behavior are normal.  ____________________________________________   LABS (all labs ordered are listed, but only abnormal results are displayed)  Labs Reviewed  COMPREHENSIVE METABOLIC PANEL - Abnormal; Notable for the following components:      Result Value   Sodium 134 (*)    Glucose, Bld 105 (*)    Alkaline Phosphatase 33 (*)    All other components within normal limits  CBC   ____________________________________________  EKG   ____________________________________________  RADIOLOGY  Ct Abdomen Pelvis W Contrast  Result Date: 07/11/2018 CLINICAL DATA:  Rectal area abscess arising 1 day after colonoscopy. EXAM: CT ABDOMEN AND PELVIS WITH CONTRAST TECHNIQUE: Multidetector CT imaging of the abdomen and pelvis was performed using the standard protocol following bolus administration of intravenous contrast. CONTRAST:  68mL ISOVUE-300 IOPAMIDOL (ISOVUE-300) INJECTION 61%, ISOVUE-300 IOPAMIDOL (ISOVUE-300) INJECTION  61% COMPARISON:  None. FINDINGS: Lower chest: Lung bases are clear. Hepatobiliary: No focal liver abnormality is seen. No gallstones, gallbladder wall thickening, or biliary dilatation. Pancreas: Unremarkable. No pancreatic ductal dilatation or surrounding inflammatory changes. Spleen: Normal in size without focal abnormality. Adrenals/Urinary Tract: Adrenal glands are unremarkable. Kidneys are normal, without renal calculi, focal lesion, or hydronephrosis. Bladder is unremarkable. Stomach/Bowel: Stomach, small bowel, and colon are not abnormally distended. No wall thickening or inflammatory changes appreciated. Appendix is normal. No significant perirectal or perianal stranding or collection identified. Vascular/Lymphatic: No significant vascular findings are present. No enlarged abdominal or pelvic lymph nodes. Reproductive: Prostate is unremarkable. Other: No abdominal wall hernia or abnormality. No  abdominopelvic ascites. Musculoskeletal: Postoperative changes with posterior fixation of the thoracolumbar spine. Posterior bone graft with bone graft donor site over the right iliac bone. IMPRESSION: No acute process demonstrated in the abdomen or pelvis. No evidence of bowel obstruction or inflammation. Electronically Signed   By: Burman NievesWilliam  Stevens M.D.   On: 07/11/2018 03:10     CT reviewed negative for acute.  Reassuring, no notable inflammatory changes around the colon. ____________________________________________   PROCEDURES  Procedure(s) performed: None  Procedures  Critical Care performed: No  ____________________________________________   INITIAL IMPRESSION / ASSESSMENT AND PLAN / ED COURSE  Pertinent labs & imaging results that were available during my care of the patient were reviewed by me and considered in my medical decision making (see chart for details).   Rectal pain: Occurring immediately and worsening since colonoscopy.  Afebrile without systemic symptoms.  Symptoms of C. difficile seem to be improving.  He is resting comfortably, but definitely has notable rectal tenderness.  Given prescription for steroid, but has not been able to get it filled yet.  [facility] not successful in arranging [facility].  Overall reassuring clinical exam with no evidence of peritonitis on exam.  No abdominal tenderness but notable rectal tenderness.  Clinical Course as of Jul 12 111  Wynelle LinkSun Jul 11, 2018  0107 Discussed with Dr. Tobi BastosAnna, he advises patient will need to close follow-up with [facility]. NO further diagnostic work-up in ED advisesd right now unless CT abd.plv considered though.   Recommends hydrocortisone suppository, once nightly for 5 nights. Contact [facility] Monday for visit within the next couple nights.    [MQ]    Clinical Course User Index [MQ] Sharyn CreamerQuale, Rilee Knoll, MD   ----------------------------------------- 1:14 AM on  07/11/2018 -----------------------------------------  Discussed with patient, he would like to proceed with CT scan to make sure he does not have any complication given the amount of pain he has been experiencing since the time of his colonoscopy.  Discussed with patient risks and benefits, patient agreeable and will proceed with CT scan to exclude etiology such as small perforation, abscess hematoma etc.   I will prescribe the patient a narcotic pain medicine due to their condition which I anticipate will cause at least moderate pain short term. I discussed with the patient safe use of narcotic pain medicines, and that they are not to drive, work in dangerous areas, or ever take more than prescribed (no more than 1 pill every 6 hours). We discussed that this is the type of medication that can be  overdosed on and the risks of this type of medicine. Patient is very agreeable to only use as prescribed and to never use more than prescribed.   Database reviewed, last narcotic prescription just a few tablets in August.   -----------------------------------------  3:58 AM on 07/11/2018 -----------------------------------------  Reassuring CT scan.  Discussed proctitis, need for close follow-up with [facility] return precautions with the patient is in agreement.  Suppository prescribed as recommended by Dr. Tobi Bastos ____________________________________________   FINAL CLINICAL IMPRESSION(S) / ED DIAGNOSES  Final diagnoses:  Rectal pain        Note:  This document was prepared using Dragon voice recognition software and may include unintentional dictation errors       Sharyn Creamer, MD 07/11/18 [phone]

## 2018-07-11 NOTE — ED Notes (Signed)
ED Provider at bedside. 

## 2018-12-01 ENCOUNTER — Other Ambulatory Visit: Admission: RE | Admit: 2018-12-01 | Source: Ambulatory Visit | Admitting: Family Medicine

## 2019-08-31 IMAGING — CT CT NECK W/ CM
4 of 5 series · 15 of 33 positions shown, 17 images · IV contrast (iopamidol)
Comparison: None.

CLINICAL DATA: Pulsatile neck mass.  Rule out Ludwig's angina.

EXAM:
CT NECK WITH CONTRAST
TECHNIQUE: Multidetector CT imaging of the neck was performed using the
standard protocol following the bolus administration of intravenous
contrast.
CONTRAST:  75mL [id number] IOPAMIDOL ([id number]) INJECTION 61%

[Series 2: axial neck · axial · 0.58mm/px · z∈[+129,+255]mm · 3 of 127 slices shown]
[im 32/127  bone]
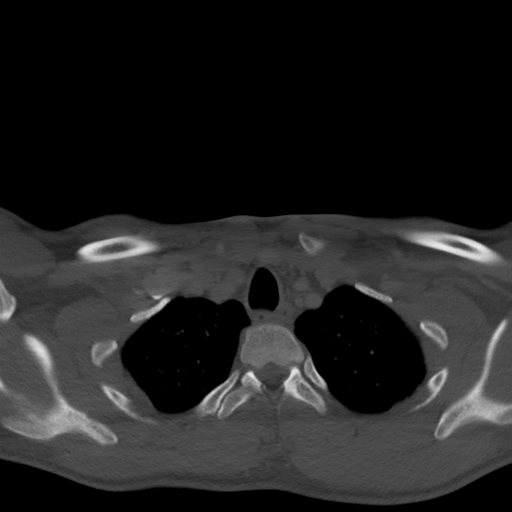
[im 64/127  bone]
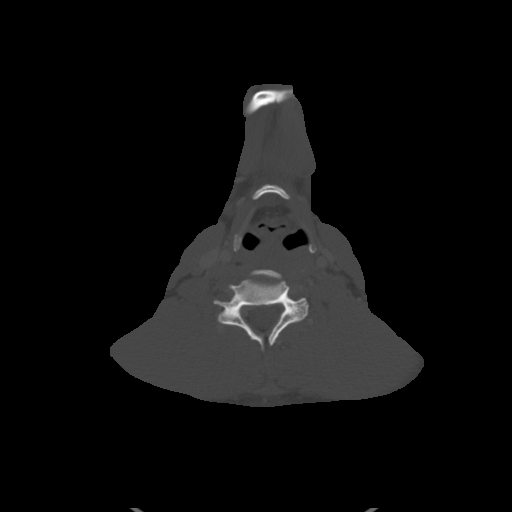
[im 95/127  bone]
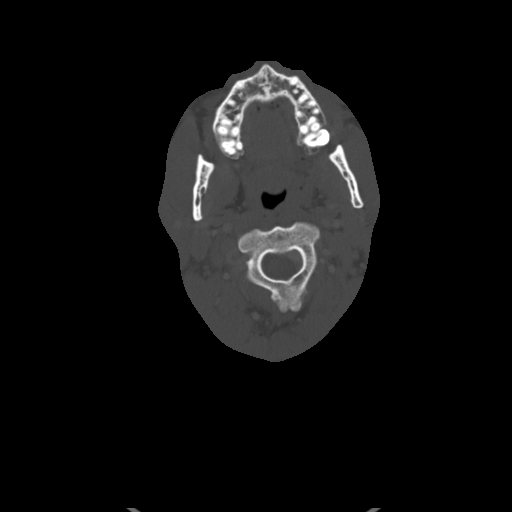

[Series 6: sag neck · sagittal · 0.54mm/px · 5 of 116 slices shown, 6 images]
[im 39/116  bone]
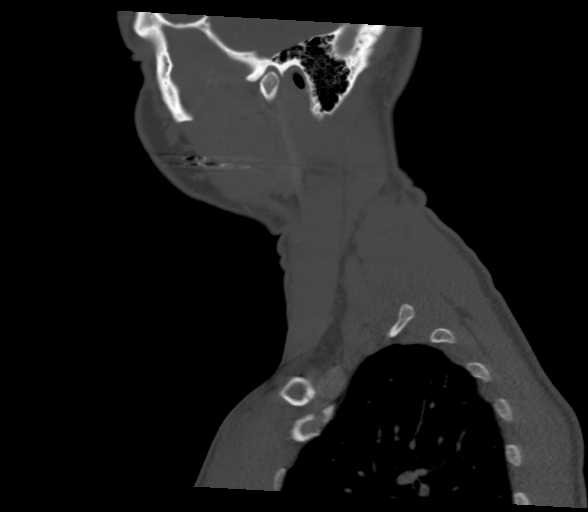
[im 48/116  bone]
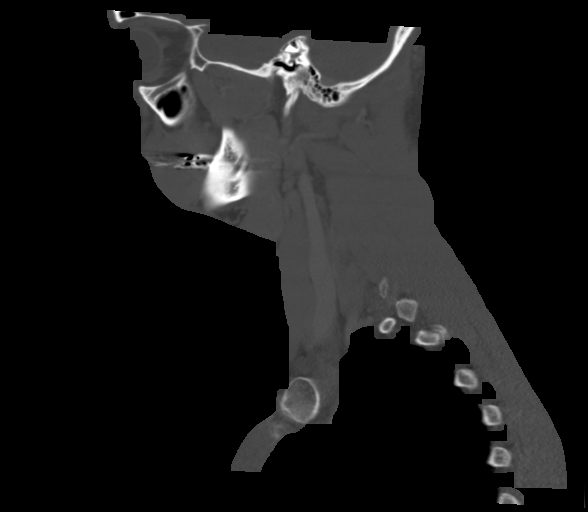
[im 58/116  soft-tissue]
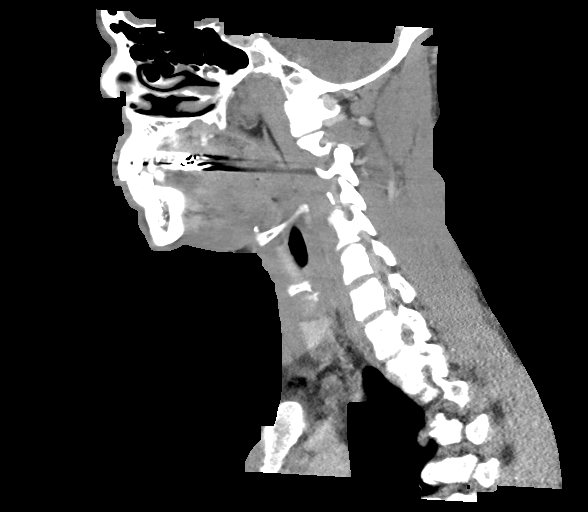
[im 58/116  bone]
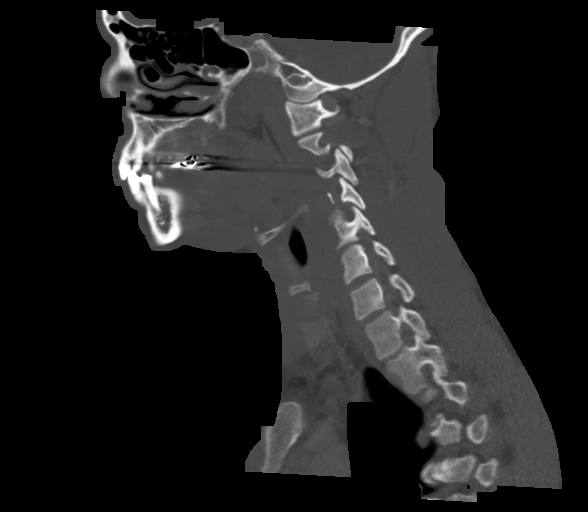
[im 68/116  bone]
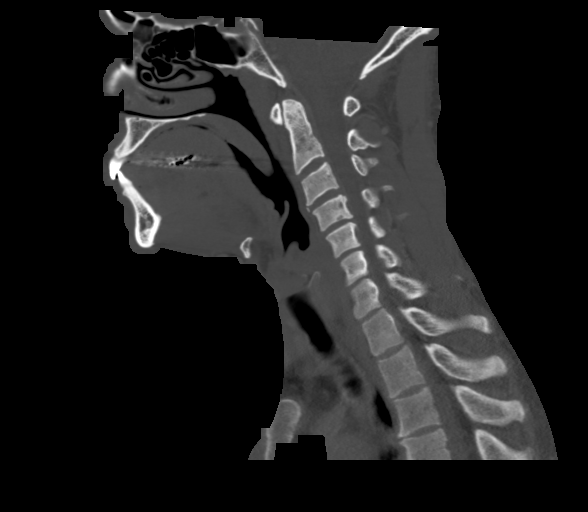
[im 77/116  bone]
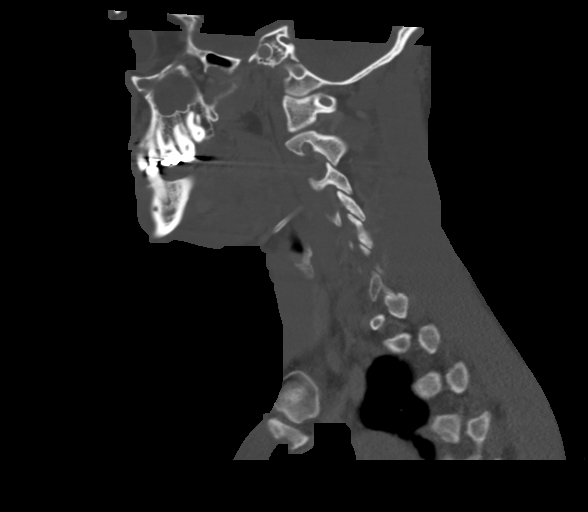

[Series 7: cor neck · coronal · 0.48mm/px · 3 of 127 slices shown]
[im 26/127  bone]
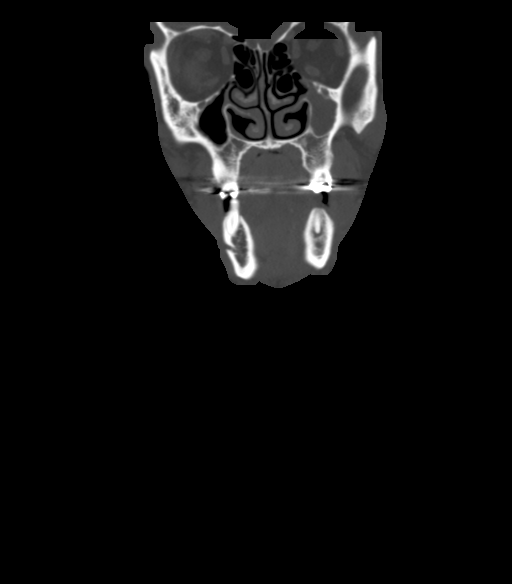
[im 51/127  bone]
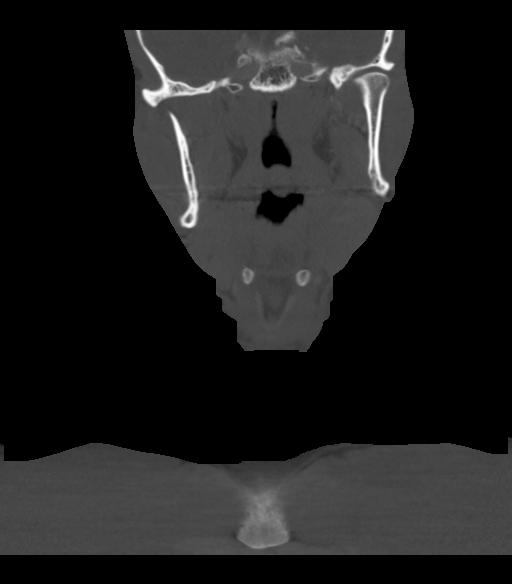
[im 76/127  bone]
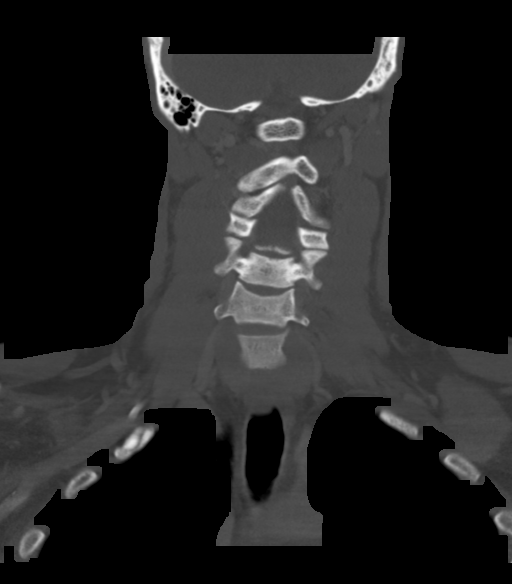

[Series 8: orthogonal ax · axial · 0.42mm/px · z∈[+98,+263]mm · 4 of 139 slices shown, 5 images]
[im 28/139  soft-tissue]
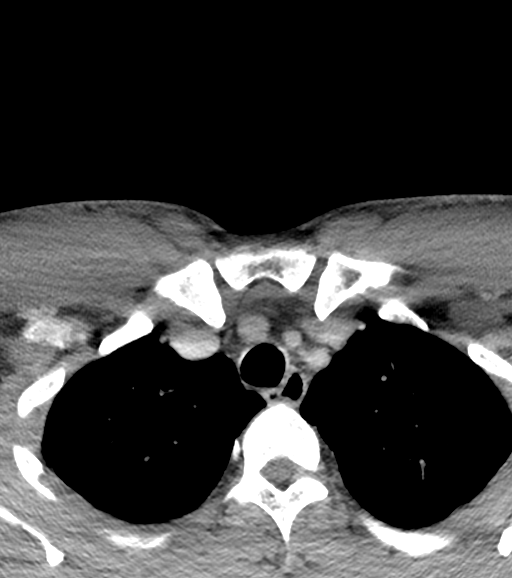
[im 28/139  bone]
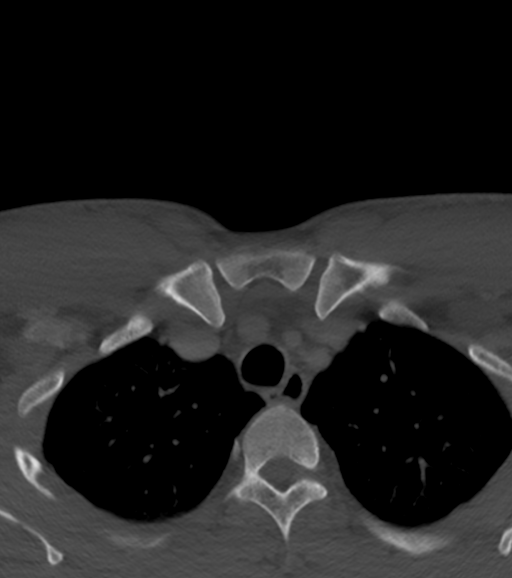
[im 56/139  bone]
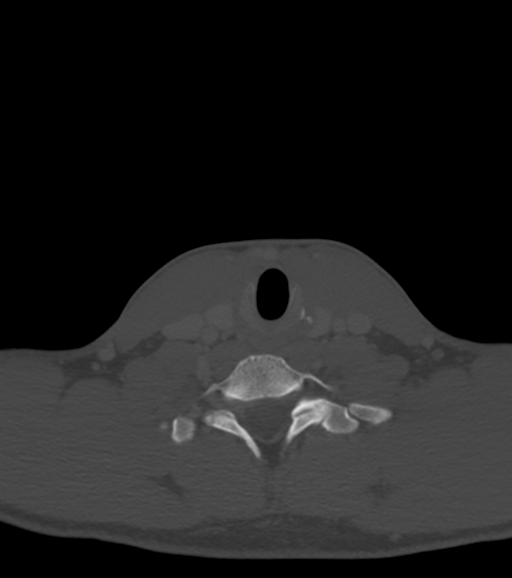
[im 83/139  bone]
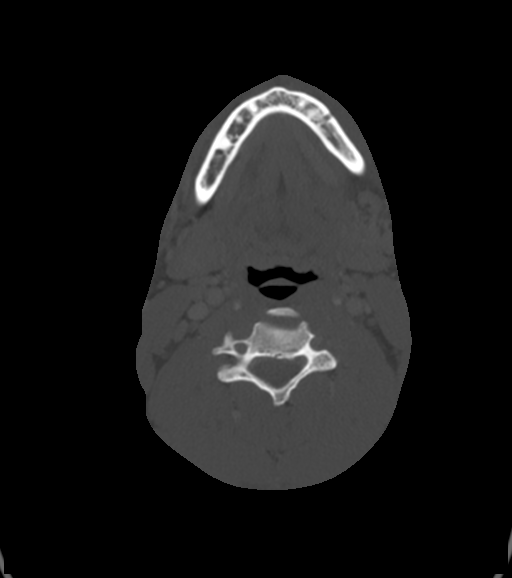
[im 111/139  bone]
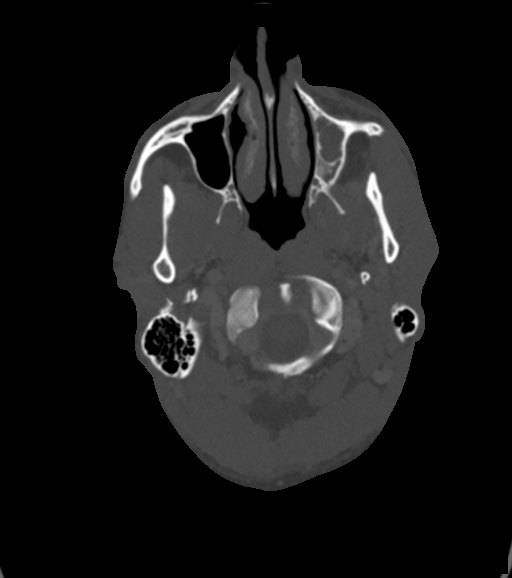

[15 of 33 positions shown; findings below may reference images not displayed]

FINDINGS: Pharynx and larynx: Soft tissue swelling in the floor of the mouth
on the left. There is edema between the mylohyoid muscle and the
mandible without well-defined fluid collection. This presumably
related to dental infection with periapical lucency around left
lower pre molar without definite cortical erosion.

Normal pharynx.  Epiglottis and larynx normal.

Salivary glands: No inflammation, mass, or stone.

Thyroid: Negative

Lymph nodes: No pathologic lymph nodes. Small subcentimeter level 2
and submandibular nodes on the left likely reactive due to infection

Vascular: Carotid artery and jugular vein patent bilaterally.

Limited intracranial: Negative

Visualized orbits: Negative

Mastoids and visualized paranasal sinuses: Mucosal edema and
contraction of the left maxillary sinus appears chronic. Remaining
sinuses clear

Skeleton: Mild cervical spine degenerative change in spurring

Upper chest: Negative

Other: Mild soft tissue swelling and edema in the subcutaneous fat
lateral and inferior to the mandible on the left. Thickening of the
left latissimus muscle.
IMPRESSION: Soft tissue swelling in the floor of the mouth on the left. There is
edema between the mylohyoid muscle and mandible presumably related
to dental infection. No well-defined abscess. Periapical lucency
around left lower pre molar presumably the cause of infection.

Mild swelling in the soft tissues and platysmas on the left below
the mandible.
